# Patient Record
Sex: Female | Born: 1950 | Race: White | Hispanic: No | Marital: Married
Health system: Southern US, Community
[De-identification: ages and names within clinical notes are randomized; demographics above are authoritative.]

## PROBLEM LIST (undated history)

## (undated) DIAGNOSIS — K7460 Unspecified cirrhosis of liver: Secondary | ICD-10-CM

## (undated) DIAGNOSIS — M810 Age-related osteoporosis without current pathological fracture: Secondary | ICD-10-CM

## (undated) DIAGNOSIS — I2510 Atherosclerotic heart disease of native coronary artery without angina pectoris: Secondary | ICD-10-CM

## (undated) DIAGNOSIS — C801 Malignant (primary) neoplasm, unspecified: Secondary | ICD-10-CM

## (undated) DIAGNOSIS — E119 Type 2 diabetes mellitus without complications: Secondary | ICD-10-CM

---

## 1999-07-14 ENCOUNTER — Encounter: Admission: RE | Admit: 1999-07-14 | Discharge: 1999-07-14 | Payer: Self-pay | Admitting: Family Medicine

## 1999-07-22 ENCOUNTER — Encounter: Payer: Self-pay | Admitting: Family Medicine

## 1999-07-22 ENCOUNTER — Encounter: Admission: RE | Admit: 1999-07-22 | Discharge: 1999-07-22 | Payer: Self-pay | Admitting: Family Medicine

## 1999-07-29 ENCOUNTER — Encounter: Admission: RE | Admit: 1999-07-29 | Discharge: 1999-07-29 | Payer: Self-pay | Admitting: Family Medicine

## 1999-07-29 ENCOUNTER — Encounter: Payer: Self-pay | Admitting: Family Medicine

## 1999-08-01 ENCOUNTER — Encounter: Admission: RE | Admit: 1999-08-01 | Discharge: 1999-08-01 | Payer: Self-pay | Admitting: Family Medicine

## 1999-08-01 ENCOUNTER — Encounter: Payer: Self-pay | Admitting: Family Medicine

## 1999-08-30 ENCOUNTER — Encounter (INDEPENDENT_AMBULATORY_CARE_PROVIDER_SITE_OTHER): Payer: Self-pay | Admitting: *Deleted

## 1999-08-30 ENCOUNTER — Ambulatory Visit (HOSPITAL_COMMUNITY): Admission: RE | Admit: 1999-08-30 | Discharge: 1999-08-31 | Payer: Self-pay | Admitting: *Deleted

## 2000-05-09 ENCOUNTER — Encounter: Admission: RE | Admit: 2000-05-09 | Discharge: 2000-08-07 | Payer: Self-pay | Admitting: Family Medicine

## 2000-08-30 ENCOUNTER — Encounter: Admission: RE | Admit: 2000-08-30 | Discharge: 2000-11-28 | Payer: Self-pay | Admitting: Family Medicine

## 2000-12-14 ENCOUNTER — Encounter: Admission: RE | Admit: 2000-12-14 | Discharge: 2001-03-14 | Payer: Self-pay | Admitting: Family Medicine

## 2001-01-07 ENCOUNTER — Encounter: Admission: RE | Admit: 2001-01-07 | Discharge: 2001-01-07 | Payer: Self-pay | Admitting: Family Medicine

## 2001-01-07 ENCOUNTER — Encounter: Payer: Self-pay | Admitting: Family Medicine

## 2001-10-23 ENCOUNTER — Ambulatory Visit (HOSPITAL_COMMUNITY): Admission: RE | Admit: 2001-10-23 | Discharge: 2001-10-23 | Payer: Self-pay | Admitting: Family Medicine

## 2002-02-18 ENCOUNTER — Encounter: Admission: RE | Admit: 2002-02-18 | Discharge: 2002-02-18 | Payer: Self-pay | Admitting: Family Medicine

## 2002-02-18 ENCOUNTER — Encounter: Payer: Self-pay | Admitting: Family Medicine

## 2002-04-02 ENCOUNTER — Ambulatory Visit (HOSPITAL_BASED_OUTPATIENT_CLINIC_OR_DEPARTMENT_OTHER): Admission: RE | Admit: 2002-04-02 | Discharge: 2002-04-02 | Payer: Self-pay | Admitting: Orthopedic Surgery

## 2004-06-28 ENCOUNTER — Observation Stay (HOSPITAL_COMMUNITY): Admission: RE | Admit: 2004-06-28 | Discharge: 2004-06-29 | Payer: Self-pay | Admitting: Urology

## 2004-07-03 ENCOUNTER — Emergency Department (HOSPITAL_COMMUNITY): Admission: EM | Admit: 2004-07-03 | Discharge: 2004-07-03 | Payer: Self-pay | Admitting: Emergency Medicine

## 2007-08-10 ENCOUNTER — Encounter: Admission: RE | Admit: 2007-08-10 | Discharge: 2007-08-10 | Payer: Self-pay | Admitting: Family Medicine

## 2007-08-20 ENCOUNTER — Encounter: Admission: RE | Admit: 2007-08-20 | Discharge: 2007-08-20 | Payer: Self-pay | Admitting: Family Medicine

## 2007-09-20 ENCOUNTER — Ambulatory Visit: Payer: Self-pay | Admitting: Vascular Surgery

## 2007-09-20 ENCOUNTER — Encounter (INDEPENDENT_AMBULATORY_CARE_PROVIDER_SITE_OTHER): Payer: Self-pay | Admitting: Orthopedic Surgery

## 2007-09-20 ENCOUNTER — Ambulatory Visit (HOSPITAL_COMMUNITY): Admission: RE | Admit: 2007-09-20 | Discharge: 2007-09-20 | Payer: Self-pay | Admitting: Orthopedic Surgery

## 2008-01-28 ENCOUNTER — Encounter: Admission: RE | Admit: 2008-01-28 | Discharge: 2008-01-28 | Payer: Self-pay | Admitting: Family Medicine

## 2008-11-20 ENCOUNTER — Observation Stay (HOSPITAL_COMMUNITY): Admission: EM | Admit: 2008-11-20 | Discharge: 2008-11-21 | Payer: Self-pay | Admitting: Emergency Medicine

## 2009-08-24 ENCOUNTER — Ambulatory Visit: Payer: Self-pay | Admitting: Diagnostic Radiology

## 2009-08-24 ENCOUNTER — Encounter: Payer: Self-pay | Admitting: Emergency Medicine

## 2009-08-24 ENCOUNTER — Inpatient Hospital Stay (HOSPITAL_COMMUNITY): Admission: EM | Admit: 2009-08-24 | Discharge: 2009-08-26 | Payer: Self-pay | Admitting: Emergency Medicine

## 2009-08-25 ENCOUNTER — Encounter (INDEPENDENT_AMBULATORY_CARE_PROVIDER_SITE_OTHER): Payer: Self-pay | Admitting: General Surgery

## 2010-06-08 ENCOUNTER — Encounter: Admission: RE | Admit: 2010-06-08 | Discharge: 2010-06-08 | Payer: Self-pay | Admitting: Family Medicine

## 2011-01-04 LAB — DIFFERENTIAL
Basophils Absolute: 0.2 10*3/uL — ABNORMAL HIGH (ref 0.0–0.1)
Lymphs Abs: 0.6 10*3/uL — ABNORMAL LOW (ref 0.7–4.0)
Monocytes Absolute: 0.2 10*3/uL (ref 0.1–1.0)
Neutrophils Relative %: 94 % — ABNORMAL HIGH (ref 43–77)

## 2011-01-04 LAB — BASIC METABOLIC PANEL
CO2: 23 mEq/L (ref 19–32)
Calcium: 9.1 mg/dL (ref 8.4–10.5)
Chloride: 104 mEq/L (ref 96–112)
GFR calc Af Amer: >60 mL/min (ref >60)
GFR calc non Af Amer: >60 mL/min (ref >60)
Glucose, Bld: 111 mg/dL — ABNORMAL HIGH (ref 70–99)
Potassium: 3.5 mEq/L (ref 3.5–5.1)
Sodium: 140 mEq/L (ref 135–145)

## 2011-01-04 LAB — URINE MICROSCOPIC-ADD ON

## 2011-01-04 LAB — CBC
MCHC: 34.7 g/dL (ref 30.0–36.0)
RBC: 4.97 MIL/uL (ref 3.87–5.11)
WBC: 17.4 10*3/uL — ABNORMAL HIGH (ref 4.0–10.5)

## 2011-01-04 LAB — URINALYSIS, ROUTINE W REFLEX MICROSCOPIC
Glucose, UA: NEGATIVE mg/dL
Urobilinogen, UA: 1 mg/dL (ref 0.0–1.0)
pH: 6 (ref 5.0–8.0)

## 2011-01-17 LAB — COMPREHENSIVE METABOLIC PANEL
Albumin: 3.7 g/dL (ref 3.5–5.2)
Alkaline Phosphatase: 87 U/L (ref 39–117)
BUN: 13 mg/dL (ref 6–23)
BUN: 9 mg/dL (ref 6–23)
CO2: 24 mEq/L (ref 19–32)
CO2: 27 mEq/L (ref 19–32)
Calcium: 8.8 mg/dL (ref 8.4–10.5)
Calcium: 9.1 mg/dL (ref 8.4–10.5)
Chloride: 109 mEq/L (ref 96–112)
Creatinine, Ser: 0.59 mg/dL (ref 0.4–1.2)
Creatinine, Ser: 0.66 mg/dL (ref 0.4–1.2)
GFR calc Af Amer: >60 mL/min (ref >60)
GFR calc non Af Amer: >60 mL/min (ref >60)
Potassium: 3.9 mEq/L (ref 3.5–5.1)
Sodium: 141 mEq/L (ref 135–145)
Sodium: 141 mEq/L (ref 135–145)
Total Bilirubin: 0.5 mg/dL (ref 0.3–1.2)
Total Protein: 6.4 g/dL (ref 6.0–8.3)
Total Protein: 6.7 g/dL (ref 6.0–8.3)

## 2011-01-17 LAB — DIFFERENTIAL
Lymphs Abs: 3 10*3/uL (ref 0.7–4.0)
Monocytes Absolute: 1 10*3/uL (ref 0.1–1.0)
Monocytes Relative: 6 % (ref 3–12)
Neutrophils Relative %: 75 % (ref 43–77)

## 2011-01-17 LAB — POCT I-STAT, CHEM 8
Calcium, Ion: 1.11 mmol/L — ABNORMAL LOW (ref 1.12–1.32)
Chloride: 106 mEq/L (ref 96–112)
Creatinine, Ser: 0.6 mg/dL (ref 0.4–1.2)
Hemoglobin: 16.3 g/dL — ABNORMAL HIGH (ref 12.0–15.0)
TCO2: 24 mmol/L (ref 0–100)

## 2011-01-17 LAB — PROTIME-INR: Prothrombin Time: 12.5 seconds (ref 11.6–15.2)

## 2011-01-17 LAB — CBC
HCT: 45.1 % (ref 36.0–46.0)
MCHC: 35.6 g/dL (ref 30.0–36.0)
MCV: 93.8 fL (ref 78.0–100.0)
Platelets: 251 10*3/uL (ref 150–400)
Platelets: 304 10*3/uL (ref 150–400)
RBC: 4.86 MIL/uL (ref 3.87–5.11)
RDW: 12.8 % (ref 11.5–15.5)
WBC: 10.4 10*3/uL (ref 4.0–10.5)

## 2011-01-17 LAB — POCT CARDIAC MARKERS
CKMB, poc: 1.1 ng/mL (ref 1.0–8.0)
Myoglobin, poc: 40.5 ng/mL (ref 12–200)
Myoglobin, poc: 59.1 ng/mL (ref 12–200)
Troponin i, poc: <0.05 ng/mL (ref 0.00–0.09)

## 2011-01-17 LAB — APTT: aPTT: 138 seconds — ABNORMAL HIGH (ref 24–37)

## 2011-01-17 LAB — TSH: TSH: 0.568 u[IU]/mL (ref 0.350–4.500)

## 2011-02-14 NOTE — H&P (Signed)
Mallory Wright, SHAPPELL NO.:  [id number]   MEDICAL RECORD NO.:  [id number]          PATIENT TYPE:  INP   LOCATION:  3705                         FACILITY:  [facility]   PHYSICIAN:  Georga Hacking, M.D.DATE OF BIRTH:  1951/06/10   DATE OF ADMISSION:  11/20/2008  DATE OF DISCHARGE:                              HISTORY & PHYSICAL   REASON FOR ADMISSION:  Shoulder pain.   HISTORY:  A 60 year old female with a history of coronary artery disease  with a previous history of PTCA of the right coronary artery over 15  years ago.  She has continued to smoke since then and has been mildly  obese.  She was last seen a year ago and had a negative stress test at  that time.  She was in her usual state of health, but has been under  situational stress and awoke this morning with severe right shoulder  pain with some radiation up into her neck.  The pain then developed into  her right posterior back and then later developed some vague tightness  in her anterior chest area.  She took 2 old nitroglycerin, but the pain  was severe and she came to the emergency room.  By that time, she had  some heaviness in her chest and initial EKG showed some very minimal  lateral ST changes, but was not impressive and initial cardiac enzymes  were negative.  She is not currently having any pain.  She was  hypertensive on admission, but became somewhat hypotensive following  nitroglycerin.   Past history is remarkable for hypertension, hyperlipidemia, anxiety,  some depression in the past, and esophageal reflux.   PAST SURGERIES:  Bilateral carpal tunnel release, C-sections x2,  cholecystectomy.   ALLERGIES:  SULFA, PREDNISONE causes her to be hyper.   CURRENT MEDICATIONS:  Vytorin, diltiazem, Nexium, aspirin, and Paxil.   FAMILY HISTORY:  Please see the old records.   SOCIAL HISTORY:  She has been under significant situational stress at  work.  She says she smokes about 2 packs of cigarettes  per day.  No  significant alcohol use.  Lives with her husband.   REVIEW OF SYSTEMS:  Significant situational stress as noted above, some  weight gain.  No eye, ear, nose, or throat problems.  Some symptoms of  reflux, some arthritis previously of the knee with a previous knee  operation.  No stroke or TIA previously.  History of anxiety.  Other  than as noted above, the remainder of systems unremarkable.   PHYSICAL EXAMINATION:  GENERAL:  Anxious, mildly obese female in no  acute distress.  VITAL SIGNS:  Blood pressure is 110/80, pulse is currently 80 and  regular.  SKIN:  Warm and dry.  HEENT:  EOMI, PERRLA.  Pharynx negative.  CNS:  Clear.  NECK:  Supple.  No masses.  No JVD, thyromegaly, or bruits.  Pulses were  equal bilaterally.  LUNGS: Clear.  CARDIAC:  Normal S1 and S2.  No S3 or murmur.  ABDOMEN:  Soft, nontender.  EXTREMITIES:  Femoral distal pulse is 2+.   EKG  shows sinus rhythm, very minimal lateral changes, probably  nonsignificant.  Chest x-ray unremarkable.  White count 17,900.  Other  than that, chemistry panel and remainder of lab was negative.   IMPRESSION:  1. Acute right shoulder pain with radiation to the neck.  This is      different than her previous angina according to her, although she      does have some residual chest tightness.  This could be atypical      angina in a patient with known coronary artery disease.  Other      possibilities could be dissection, pulmonary embolus, although      doubt with negative D-dimer.  2. Coronary artery disease with previous percutaneous transluminal      coronary angioplasty of the right coronary artery.  3. Ongoing cigarette abuse.  4. Hypertension.  5. Obesity.  6. Anxiety and depression.  7. History of reflux.   RECOMMENDATIONS:  The patient will be started on heparin and if she has  recurrent pain, begin IV nitroglycerin, rule out MI with serial CPK and  EKG.  Consider catheterization to exclude  progression of coronary artery  disease.      Georga Hacking, M.D.  Electronically Signed     WST/MEDQ  D:  11/20/2008  T:  11/20/2008  Job:  [id number]   cc:   [facility]

## 2011-02-14 NOTE — Cardiovascular Report (Signed)
Mallory Wright, HAACK NO.:  [id number]   MEDICAL RECORD NO.:  [id number]          PATIENT TYPE:  INP   LOCATION:  3709                         FACILITY:  [facility]   PHYSICIAN:  Georga Hacking, M.D.DATE OF BIRTH:  09/20/1951   DATE OF PROCEDURE:  11/20/2008  DATE OF DISCHARGE:                            CARDIAC CATHETERIZATION   HISTORY:  This is a 60 year old female with a previous history of  coronary artery disease with PTCA of the right coronary artery.  She  presented with severe acute right shoulder pain radiating into her neck  which then became anterior in her chest.  Initial cardiac enzymes were  negative.  Pain was somewhat different from previous angina but was  still suggestive of ischemia.   PROCEDURE:  Left heart catheterization with coronary angiograms and left  ventriculogram.   COMMENTS ABOUT PROCEDURE:  The procedure was done in the Cardiac  Catheterization Laboratory.  She was prepped and draped in the usual  manner and given 2 mg of Versed intravenously for sedation.  The right  femoral artery was entered using a single anterior needle wall stick and  the procedure was done using 6-French catheters.  A 30 mL ventriculogram  was performed at the end of the procedure.  She tolerated the procedure  well.   HEMODYNAMIC DATA:  1. Aorta post-contrast 190/60.  2. LV post-contrast 190/3-13.   ANGIOGRAPHIC DATA:  Left ventriculogram:  Performed in the 30-degree RAO  projection.  The aortic valve is normal.  The mitral valve is normal.  The left ventricle appears normal in size.  The estimated ejection  fraction was 65-70%.  Coronary arteries arise and distribute normally.  There is very minimal coronary calcification noted.  The left main  coronary artery is normal.  The left anterior descending is calcified  proximally with an eccentric 40-50% proximal stenosis.  The diagonal  branch has 20% proximal stenosis.  Distal vessel contained scattered  irregularities.  Circumflex coronary artery contains 2 marginal arteries  and mild calcification.  No significant disease is noted, although there  are scattered luminal irregularities noted.  The right coronary artery  is a large dominant vessel.  The previous angioplasty site is widely  patent.  Mild calcification is noted in the mid vessel.  Posterior  descending and posterolateral branch arise and contain no significant  stenoses.   IMPRESSION:  1. Coronary artery disease predominately involving the proximal left      anterior descending of moderate severity, mild irregularities      involving the circumflex and right coronary artery.  2. Normal left ventricular function.   RECOMMENDATIONS:  Evaluation for other sources of shoulder pain.  Advised to stop smoking and weight reduction.      Georga Hacking, M.D.  Electronically Signed     WST/MEDQ  D:  11/20/2008  T:  11/21/2008  Job:  [id number]

## 2011-02-17 NOTE — Op Note (Signed)
[facility]  Patient:    Mallory Wright, Mallory Wright Visit Number: [id number] MRN: [id number]          Service Type: DSU Location: [facility] Attending Physician:  Marlowe Shores Dictated by:   Artist Pais Mina Marble, M.D. Proc. Date: 04/02/02 Admit Date:  04/02/2002                             Operative Report  PREOPERATIVE DIAGNOSIS:  Right carpal tunnel syndrome.  POSTOPERATIVE DIAGNOSIS:  Right carpal tunnel syndrome.  PROCEDURE:  Right carpal tunnel release.  SURGEON:  Artist Pais. Mina Marble, M.D.  ASSISTANT:  Junius Roads. Ireton, P.A.C.  ANESTHESIA:  General.  TOURNIQUET TIME:  12 minutes.  DRAINS:  None.  DESCRIPTION OF PROCEDURE: The patient was taken to the operating room, where after the induction of adequate general anesthesia the right upper extremity was prepped and draped in the usual sterile fashion.  An Esmarch was used to exsanguinate the limb.  A tourniquet was inflated to 250 mmHg.  At this point in time a 2 cm incision was made in the palmar aspect of the right hand in line with the long finger metacarpal starting at Kaplans cardinal line.  The incision was taken down through the skin and subcutaneous tissues until the palmar fascia was identified.  The palmar fascia was split longitudinally, thus exposing the superficial palmar arch and the distal edge of the transverse carpal ligament.  The superficial palmar arch was retracted distally and the transverse carpal ligament was divided with a 15 blade, exposing the underlying median nerve and contents of the carpal canal.  Once this was done, the [facility] elevator was passed dorsal and volar to the remaining aspect of the ligament, which was divided under direct vision using a blunt scissors.  After this was done, the canal was inspected, no interosseous or ganglion lesions present.  The wound was thoroughly irrigated and closed with a running 3-0 Prolene subcuticular stitch.  Steri-Strips, 4  x 4s, and fluffs and a compressive hand dressing were applied.  The patient tolerated the procedure well, went to the recovery room in stable fashion. Dictated by:   Artist Pais Mina Marble, M.D. Attending Physician:  Marlowe Shores DD:  04/02/02 TD:  04/04/02 Job: [id number]

## 2011-02-17 NOTE — Op Note (Signed)
Mallory Wright, Mallory Wright NO.:  [id number]   MEDICAL RECORD NO.:  [id number]          PATIENT TYPE:  OBV   LOCATION:  0347                         FACILITY:  [facility]   PHYSICIAN:  Gita Kudo, M.D. DATE OF BIRTH:  June 23, 1951   DATE OF PROCEDURE:  06/28/2004  DATE OF DISCHARGE:                                 OPERATIVE REPORT   OPERATIVE PROCEDURE:  Laparoscopic umbilical/incisional hernia repair.   SURGEON:  Gita Kudo, M.D.   ASSISTANT:  Velora Heckler, M.D.   ANESTHESIA:  General endotracheal.   PREOPERATIVE DIAGNOSIS:  Incisional hernia.   POSTOPERATIVE DIAGNOSIS:  Incisional hernia.   CLINICAL SUMMARY:  A 60 year old female with urologic problems that Dr.  Isabel Caprice is taking care of and also a hernia at her umbilicus.  She had a  laparoscopic cholecystectomy approximately two years ago, and this hernia  has developed following that.   OPERATIVE FINDINGS:  Dr. Isabel Caprice described his operation separately.  The  patient had a hernia of her abdominal wall with at least three defects,  including one large one.  A large piece (15 x 20 cm) of Proceed surgical  mesh was used with good 4 cm overlap of the entire weakened area.   PROCEDURE:  After Dr. Isabel Caprice finished his procedure, the patient was  positioned, prepped and draped in a standard fashion.  An Optiview port was  used in the left upper quadrant to provide access, and then the camera  placed, and two other #11 ports placed, left lower quadrant and right  lateral abdomen.  Then a spinal needle was used to delineate the defect and  marked.  Measuring out 4 cm from the defect, we then completed this as an  ellipse, and it measured approximately 13 x 19 cm.  We selected a 15 x 20 cm  piece of mesh, Proceed, to use.  A mesh was prepared by placing eight  symmetrical sutures around the periphery and marking it.  Then the mesh was  inserted into the abdomen, and we were certain that the Prolene side was  up  towards the abdominal wall.  Then at symmetrically placed skin incisions,  the sutures were brought and tied, and the mesh was well deployed and under  just a little tension, as wished.  Following this, two concentric rows of  staples were used to tap down the mesh, and it was well placed and in good  position without any sagging or open areas.  Following this, the ports and  CO2 were released.  The smaller incisions were approximated with Steri-Strips.  The three larger  were closed with 4-0 Vicryl subcu and Steri-Strips.  Sterile dressings and a  binder were then applied, and the patient went from the recovery room from  the operating room in good condition.      MRL/MEDQ  D:  06/28/2004  T:  06/28/2004  Job:  [id number]   cc:   Valetta Fuller, M.D.  [address]  [place]  [place] [address]  Fax: [phone]   W. Ashley Royalty.,  M.D.  [address]  [place]  [place] [address]  Fax: [phone]   Attn:  Elizabeth Palau, FNP [facility]

## 2011-02-17 NOTE — Discharge Summary (Signed)
Mallory Wright, Mallory Wright NO.:  [id number]   MEDICAL RECORD NO.:  [id number]          PATIENT TYPE:  OBV   LOCATION:  3709                         FACILITY:  [facility]   PHYSICIAN:  Georga Hacking, M.D.DATE OF BIRTH:  18-Mar-1951   DATE OF ADMISSION:  11/20/2008  DATE OF DISCHARGE:  11/21/2008                               DISCHARGE SUMMARY   FINAL DIAGNOSES:  1. Acute right shoulder pain with neck radiation, suggestive cervical      disk disease, myocardial infarction ruled out.  2. Chest pain, myocardial function ruled out.  3. Coronary artery disease with previous percutaneous transluminal      coronary angioplasty of the right coronary artery with widely      patent percutaneous transluminal coronary angioplasty site for many      years ago, minimal circumflex disease, and moderate 50% left      anterior descending stenosis, normal left ventricular function.  4. Ongoing cigarette abuse.  5. Hypertension.  6. Hyperlipidemia.  7. Anxiety and depression.  8. History of reflux.   PROCEDURES:  Cardiac catheterization and MRI of the cervical spine.   HISTORY OF PRESENT ILLNESS:  A 60 year old female with a history of  previous PTCA of the right coronary artery over 50 years ago.  She has  continued to smoke since then and has been mildly obese.  She was in her  usual state of health, but under situational stress and woke the morning  of admission with severe right shoulder pain with radiation up into her  neck.  The pain then developed into her right posterior back and later  vague tightness in her anterior chest area.  She took 2 old  nitroglycerin, but the pain was severe.  She came to the emergency room.  By that time, she had heaviness in her chest and initial EKG showed  minimal lateral ST changes, but not impressive and initial enzymes were  negative.  She was admitted for further evaluation.  Please see  previously dictated history and physical for remainder  of the details.   HOSPITAL COURSE:  Initial white count was 17,900, but was 10,400 the  next day.  Hemoglobin is 13.6, hematocrit of 39.1.  PT and PTT were  normal.  Chemistry panel showed glucose of 130, creatinine of 0.66, BUN  13, sodium 141, potassium 3.9, and chloride 107.  Serial CPK and MBs  were negative.  TSH is 0.568.  MRA of the neck showed some cervical disk  disease.  The final report is not back in the chart at time of  dictation.  She was treated overnight with some heparin and the next day  was taken to the Cath Lab.  Left main coronary artery was normal.  Left  anterior descending has a calcific 40-50% proximal stenosis.  Circumflex  had irregularities.  Right coronary angioplasty she had 16 years ago was  widely patent.  The MRA showed mild spinal stenosis with disk bulge at  C5 and C6 with degeneration.  She went home the next day and her cath  site was checked and  was doing well.   DISCHARGE MEDICATIONS:  1. Nexium 40 mg daily.  2. Vytorin daily.  3. Diltiazem 120 mg daily.  4. Paxil 20 mg daily.  5. Zyrtec 10 mg daily.  6. Garlic daily.  7. Fish oil daily.  8. Glucosamine.  9. Aspirin.  10.She is to use ibuprofen as needed for pain.   She is to follow up in the office in 1 week.      Georga Hacking, M.D.  Electronically Signed     WST/MEDQ  D:  12/21/2008  T:  12/22/2008  Job:  [id number]   cc:   [facility]

## 2011-02-17 NOTE — Op Note (Signed)
NAMECAPRICIA, Mallory Wright NO.:  [id number]   MEDICAL RECORD NO.:  [id number]          PATIENT TYPE:  OBV   LOCATION:  0347                         FACILITY:  [facility]   PHYSICIAN:  Gita Kudo, M.D. DATE OF BIRTH:  August 21, 1951   DATE OF PROCEDURE:  06/29/2004  DATE OF DISCHARGE:                                 OPERATIVE REPORT   ADDENDUM:  I did not mention in the original operative note that after the  ports were placed and the camera inserted, I operated through the lower port  to reduce the incarcerated omentum that was in the hernia defect.  The  omentum came out with gentle traction, and cautery was used to control any  bleed.  There was a fair amount of omentum, and it was taken out  successfully and without any complication.  Following that, the defects were  noted as above and repaired as described.      MRL/MEDQ  D:  06/29/2004  T:  06/29/2004  Job:  [id number]

## 2011-02-17 NOTE — Op Note (Signed)
Mallory Wright, PUCCINELLI                 ACCOUNT NO.:  [id number]   MEDICAL RECORD NO.:  [id number]          PATIENT TYPE:  AMB   LOCATION:  DAY                          [facility]ACILITY[facility]   PHYSICIAN:  Valetta Fuller, M.D.  DATE OF BIRTH:  09-07-1951   DATE OF PROCEDURE:  06/28/2004  DATE OF DISCHARGE:                                 OPERATIVE REPORT   PREOPERATIVE DIAGNOSIS:  Stress urinary incontinence.   POSTOPERATIVE DIAGNOSIS:  Stress urinary incontinence.   PROCEDURE PERFORMED:  Transobturator suburethral sling.   SURGEON:  Valetta Fuller, M.D.   ANESTHESIA:  General.   INDICATIONS:  Ms. Cosman came to see me recently with progressive stress  incontinence.  She had moderate to severe urinary incontinence with  coughing, sneezing, and laughing.  She was wearing pads on a regular basis.  The patient has also had some incisional hernias which appear to require  surgical intervention, and she has been evaluated by Dr. Maryagnes Amos.  When I saw  her, she did have a mild cystocele with urethral hypermobility.  She did  have significant objective stress urinary incontinence.  The patient  underwent extensive counseling with regard to treatment options.  She was  felt to be a reasonably good candidate for suburethral sling.  She appeared  to understand the advantages and disadvantages of surgery as well as the  success rates, potential complications, etc.  We are planning a combined  procedure with Dr. Maryagnes Amos to address the incisional hernias.   TECHNIQUE AND FINDINGS:  The patient was brought to the operating room where  she had successful induction of general anesthesia.  She was placed in  lithotomy position and prepped and draped in the usual manner.  A Foley  catheter was inserted, and the bladder was completely drained.  With a  weighted vaginal speculum, we noticed the patient did have a mild cystocele,  but we really did not feel that formal anterior repair was really required.  For  that reason, we made a small 2 cm incision over the mid urethra.  Vaginal planes were dissected until we were able to feel along the pelvic  sidewall.  We decided to do a transobturator approach and make two markings  about 4.5 cm lateral at the level of the clitoris on both sides.  Needles  were passed in the standard manner with direct digital finger control.  The  sling material was then passed, and a right-angle clamp was utilized  underneath the sling at the level of the mid urethra.  Once we felt tension  was appropriate, the redundant sling was cut.  Those small incisions were  closed with Dermabond.  The vaginal incision was closed with a running 2-0  Vicryl suture, and some vaginal packing was applied.  The patient appeared  to tolerate the procedure well, and there were no obvious complications.      DSG/MEDQ  D:  06/28/2004  T:  06/28/2004  Job:  [id number]

## 2011-10-01 ENCOUNTER — Other Ambulatory Visit: Payer: Self-pay | Admitting: Cardiology

## 2011-10-23 ENCOUNTER — Other Ambulatory Visit: Payer: Self-pay | Admitting: Cardiology

## 2012-03-21 ENCOUNTER — Other Ambulatory Visit: Payer: Self-pay | Admitting: Family Medicine

## 2012-03-21 DIAGNOSIS — Z1231 Encounter for screening mammogram for malignant neoplasm of breast: Secondary | ICD-10-CM

## 2012-04-17 ENCOUNTER — Ambulatory Visit
Admission: RE | Admit: 2012-04-17 | Discharge: 2012-04-17 | Disposition: A | Discharge status: Home / Self Care | Payer: 59 | Source: Ambulatory Visit | Attending: Family Medicine | Admitting: Family Medicine

## 2012-04-17 DIAGNOSIS — Z1231 Encounter for screening mammogram for malignant neoplasm of breast: Secondary | ICD-10-CM

## 2012-10-10 ENCOUNTER — Ambulatory Visit
Admission: RE | Admit: 2012-10-10 | Discharge: 2012-10-10 | Disposition: A | Discharge status: Home / Self Care | Payer: 59 | Source: Ambulatory Visit | Attending: Cardiology | Admitting: Cardiology

## 2012-10-10 ENCOUNTER — Other Ambulatory Visit: Payer: Self-pay | Admitting: Cardiology

## 2012-10-10 DIAGNOSIS — R0989 Other specified symptoms and signs involving the circulatory and respiratory systems: Secondary | ICD-10-CM

## 2012-10-10 DIAGNOSIS — R0609 Other forms of dyspnea: Secondary | ICD-10-CM

## 2012-10-16 ENCOUNTER — Encounter (INDEPENDENT_AMBULATORY_CARE_PROVIDER_SITE_OTHER): Payer: 59 | Admitting: *Deleted

## 2012-10-16 DIAGNOSIS — R609 Edema, unspecified: Secondary | ICD-10-CM

## 2013-08-06 ENCOUNTER — Ambulatory Visit: Payer: 59

## 2013-08-13 ENCOUNTER — Ambulatory Visit: Payer: 59

## 2013-08-19 ENCOUNTER — Ambulatory Visit: Payer: 59

## 2013-08-20 ENCOUNTER — Ambulatory Visit: Payer: 59

## 2013-08-26 ENCOUNTER — Ambulatory Visit: Payer: 59

## 2013-09-02 ENCOUNTER — Ambulatory Visit: Payer: 59

## 2015-11-04 ENCOUNTER — Other Ambulatory Visit: Payer: Self-pay

## 2015-11-04 DIAGNOSIS — Z1231 Encounter for screening mammogram for malignant neoplasm of breast: Secondary | ICD-10-CM

## 2015-11-17 ENCOUNTER — Ambulatory Visit: Payer: Self-pay

## 2015-11-24 ENCOUNTER — Ambulatory Visit
Admission: RE | Admit: 2015-11-24 | Discharge: 2015-11-24 | Disposition: A | Discharge status: Home / Self Care | Payer: Commercial Managed Care - HMO | Source: Ambulatory Visit

## 2015-11-24 DIAGNOSIS — Z1231 Encounter for screening mammogram for malignant neoplasm of breast: Secondary | ICD-10-CM

## 2016-12-11 ENCOUNTER — Other Ambulatory Visit: Payer: Self-pay | Admitting: Physician Assistant

## 2016-12-11 DIAGNOSIS — Z1231 Encounter for screening mammogram for malignant neoplasm of breast: Secondary | ICD-10-CM

## 2016-12-29 ENCOUNTER — Ambulatory Visit
Admission: RE | Admit: 2016-12-29 | Discharge: 2016-12-29 | Disposition: A | Discharge status: Home / Self Care | Payer: Commercial Managed Care - HMO | Source: Ambulatory Visit | Attending: Physician Assistant | Admitting: Physician Assistant

## 2016-12-29 DIAGNOSIS — Z1231 Encounter for screening mammogram for malignant neoplasm of breast: Secondary | ICD-10-CM

## 2017-05-21 ENCOUNTER — Other Ambulatory Visit (HOSPITAL_COMMUNITY): Payer: Self-pay | Admitting: Internal Medicine

## 2017-05-21 DIAGNOSIS — K7469 Other cirrhosis of liver: Secondary | ICD-10-CM

## 2017-05-28 ENCOUNTER — Ambulatory Visit (HOSPITAL_COMMUNITY): Payer: Commercial Managed Care - HMO

## 2017-05-28 ENCOUNTER — Ambulatory Visit (HOSPITAL_COMMUNITY)
Admission: RE | Admit: 2017-05-28 | Discharge: 2017-05-28 | Disposition: A | Discharge status: Home / Self Care | Payer: 59 | Source: Ambulatory Visit | Attending: Internal Medicine | Admitting: Internal Medicine

## 2017-05-28 DIAGNOSIS — K7469 Other cirrhosis of liver: Secondary | ICD-10-CM | POA: Diagnosis not present

## 2018-01-10 ENCOUNTER — Other Ambulatory Visit: Payer: Self-pay | Admitting: Physician Assistant

## 2018-01-10 DIAGNOSIS — Z1231 Encounter for screening mammogram for malignant neoplasm of breast: Secondary | ICD-10-CM

## 2018-01-14 ENCOUNTER — Ambulatory Visit
Admission: RE | Admit: 2018-01-14 | Discharge: 2018-01-14 | Disposition: A | Discharge status: Home / Self Care | Payer: Medicare Other | Source: Ambulatory Visit | Attending: Physician Assistant | Admitting: Physician Assistant

## 2018-01-14 DIAGNOSIS — Z1231 Encounter for screening mammogram for malignant neoplasm of breast: Secondary | ICD-10-CM

## 2018-01-15 ENCOUNTER — Other Ambulatory Visit: Payer: Self-pay | Admitting: Physician Assistant

## 2018-01-15 DIAGNOSIS — E2839 Other primary ovarian failure: Secondary | ICD-10-CM

## 2018-02-07 ENCOUNTER — Other Ambulatory Visit: Payer: Medicare Other

## 2018-02-11 ENCOUNTER — Ambulatory Visit
Admission: RE | Admit: 2018-02-11 | Discharge: 2018-02-11 | Disposition: A | Discharge status: Home / Self Care | Payer: Medicare Other | Source: Ambulatory Visit | Attending: Physician Assistant | Admitting: Physician Assistant

## 2018-02-11 DIAGNOSIS — E2839 Other primary ovarian failure: Secondary | ICD-10-CM

## 2018-02-25 ENCOUNTER — Emergency Department (HOSPITAL_COMMUNITY): Payer: Medicare Other

## 2018-02-25 ENCOUNTER — Encounter (HOSPITAL_COMMUNITY): Payer: Self-pay

## 2018-02-25 ENCOUNTER — Emergency Department (HOSPITAL_COMMUNITY)
Admission: EM | Admit: 2018-02-25 | Discharge: 2018-02-25 | Disposition: A | Discharge status: Home / Self Care | Payer: Medicare Other | Attending: Emergency Medicine | Admitting: Emergency Medicine

## 2018-02-25 DIAGNOSIS — N950 Postmenopausal bleeding: Secondary | ICD-10-CM | POA: Diagnosis not present

## 2018-02-25 DIAGNOSIS — M545 Low back pain: Secondary | ICD-10-CM | POA: Diagnosis present

## 2018-02-25 DIAGNOSIS — Z79899 Other long term (current) drug therapy: Secondary | ICD-10-CM | POA: Insufficient documentation

## 2018-02-25 DIAGNOSIS — Z85118 Personal history of other malignant neoplasm of bronchus and lung: Secondary | ICD-10-CM | POA: Insufficient documentation

## 2018-02-25 DIAGNOSIS — I2510 Atherosclerotic heart disease of native coronary artery without angina pectoris: Secondary | ICD-10-CM | POA: Insufficient documentation

## 2018-02-25 DIAGNOSIS — Z7982 Long term (current) use of aspirin: Secondary | ICD-10-CM | POA: Insufficient documentation

## 2018-02-25 DIAGNOSIS — E119 Type 2 diabetes mellitus without complications: Secondary | ICD-10-CM | POA: Insufficient documentation

## 2018-02-25 HISTORY — DX: Malignant (primary) neoplasm, unspecified: C80.1

## 2018-02-25 HISTORY — DX: Age-related osteoporosis without current pathological fracture: M81.0

## 2018-02-25 HISTORY — DX: Unspecified cirrhosis of liver: K74.60

## 2018-02-25 HISTORY — DX: Atherosclerotic heart disease of native coronary artery without angina pectoris: I25.10

## 2018-02-25 HISTORY — DX: Type 2 diabetes mellitus without complications: E11.9

## 2018-02-25 LAB — CBC WITH DIFFERENTIAL/PLATELET
Abs Immature Granulocytes: 0 10*3/uL (ref 0.0–0.1)
Basophils Absolute: 0.1 10*3/uL (ref 0.0–0.1)
Basophils Relative: 1 %
EOS PCT: 3 %
Eosinophils Absolute: 0.3 10*3/uL (ref 0.0–0.7)
HEMATOCRIT: 44.6 % (ref 36.0–46.0)
HEMOGLOBIN: 14.5 g/dL (ref 12.0–15.0)
Immature Granulocytes: 0 %
LYMPHS ABS: 2.3 10*3/uL (ref 0.7–4.0)
LYMPHS PCT: 20 %
MCH: 30.2 pg (ref 26.0–34.0)
MCHC: 32.5 g/dL (ref 30.0–36.0)
MCV: 92.9 fL (ref 78.0–100.0)
MONOS PCT: 9 %
Monocytes Absolute: 1 10*3/uL (ref 0.1–1.0)
Neutro Abs: 7.6 10*3/uL (ref 1.7–7.7)
Neutrophils Relative %: 67 %
Platelets: 266 10*3/uL (ref 150–400)
RBC: 4.8 MIL/uL (ref 3.87–5.11)
RDW: 12.5 % (ref 11.5–15.5)
WBC: 11.2 10*3/uL — AB (ref 4.0–10.5)

## 2018-02-25 LAB — BASIC METABOLIC PANEL
Anion gap: 9 (ref 5–15)
BUN: 11 mg/dL (ref 6–20)
CHLORIDE: 105 mmol/L (ref 101–111)
CO2: 26 mmol/L (ref 22–32)
Calcium: 9.7 mg/dL (ref 8.9–10.3)
Creatinine, Ser: 0.74 mg/dL (ref 0.44–1.00)
GFR calc Af Amer: >60 mL/min (ref >60)
GFR calc non Af Amer: >60 mL/min (ref >60)
GLUCOSE: 118 mg/dL — AB (ref 65–99)
POTASSIUM: 4.5 mmol/L (ref 3.5–5.1)
Sodium: 140 mmol/L (ref 135–145)

## 2018-02-25 LAB — URINALYSIS, ROUTINE W REFLEX MICROSCOPIC
Bacteria, UA: NONE SEEN
Bilirubin Urine: NEGATIVE
Glucose, UA: NEGATIVE mg/dL
Ketones, ur: NEGATIVE mg/dL
Leukocytes, UA: NEGATIVE
NITRITE: NEGATIVE
Protein, ur: NEGATIVE mg/dL
SPECIFIC GRAVITY, URINE: 1.015 (ref 1.005–1.030)
pH: 5 (ref 5.0–8.0)

## 2018-02-25 NOTE — ED Provider Notes (Signed)
Patient placed in Quick Look pathway, seen and evaluated   Chief Complaint: Lower back pain, hematuria  HPI:   She presents for evaluation of 1-1/2-week history of lower back pain.  She has noticed gross hematuria for the past 3 to 4 days.  She also reports blood when she wipes after urination.  Some diarrhea, nausea.  Denies vomiting.  Evaluated at urgent care and was found to have hematuria on urine dipstick and was sent here for further work-up for possible kidney stone.  Denies any injuries that incited the back pain.  Denies any numbness in legs, dysuria, loss of bowel or bladder function, prior back surgeries or fever.   ROS: Low back pain  Physical Exam:   Gen: No distress  Neuro: Awake and Alert  Skin: Warm    Focused Exam: No midline or paraspinal musculature tenderness to palpation of the lumbar spine.  Negative CVA tenderness bilaterally.   Initiation of care has begun. The patient has been counseled on the process, plan, and necessity for staying for the completion/evaluation, and the remainder of the medical screening examination    Delia Heady, PA-C 02/25/18 Schuyler, [place], DO 02/25/18 1508

## 2018-02-25 NOTE — ED Provider Notes (Signed)
[facility] Provider Note   CSN: [id number] Arrival date & time: 02/25/18  1354     History   Chief Complaint Chief Complaint  Patient presents with  . Hematuria    HPI Mallory Wright is a 67 y.o. female.  Patient is a 67 year old female with a history of coronary artery disease, diabetes, cirrhosis, lung cancer and osteoporosis who presents today with bleeding which she is not sure is coming from her vaginal area or urine, lower back pain that comes in waves as cramps and some mild nausea and intermittent bouts of loose stool.  This is been going on for the last week.  She denies any frequency, urgency or dysuria.  She has had no fevers.  Nothing seems to make the symptoms better or worse.  The bleeding has become a little bit heavier over the last 24 hours and she is wearing a pad in her underwear.  She denies any feelings of lightheadedness, chest pain or shortness of breath.  She takes no anticoagulation.  She sees her PCP Dr. Frederico Hamman regularly and last had her normal routine exam in February.  Patient is postmenopausal and cannot quite remember when her last period was but it was in her 64s.  She has never had bleeding since then until now.  The history is provided by the patient.    Past Medical History:  Diagnosis Date  . Cancer ([place])    lung  . Cirrhosis ([place])   . Coronary artery disease   . Diabetes mellitus without complication ([place])   . Osteoporosis     There are no active problems to display for this patient.   History reviewed. No pertinent surgical history.   OB History   None      Home Medications    Prior to Admission medications   Medication Sig Start Date End Date Taking? Authorizing Provider  aspirin EC 81 MG tablet Take 81 mg by mouth daily.   Yes [provider]  cetirizine (ZYRTEC) 10 MG tablet Take 10 mg by mouth daily.   Yes [provider]  Cinnamon 500 MG capsule Take 500 mg by mouth  daily.   Yes [provider]  diltiazem (CARDIZEM) 120 MG tablet Take 120 mg by mouth daily. 06/09/15  Yes [provider]  esomeprazole (NEXIUM) 40 MG capsule Take 40 mg by mouth daily at 12 noon.   Yes [provider]  ezetimibe-simvastatin (VYTORIN) 10-40 MG tablet Take 1 tablet by mouth daily. 06/03/15  Yes [provider]  Garlic Oil 2 MG CAPS Take 1,000 mg by mouth daily.   Yes [provider]  glucosamine-chondroitin 500-400 MG tablet Take 1 tablet by mouth 2 (two) times daily.   Yes [provider]  levothyroxine (SYNTHROID, LEVOTHROID) 25 MCG tablet Take 25 mcg by mouth daily. 01/13/18  Yes [provider]  lisinopril (PRINIVIL,ZESTRIL) 10 MG tablet Take 10 mg by mouth daily. 02/28/17  Yes [provider]  metFORMIN (GLUCOPHAGE-XR) 500 MG 24 hr tablet Take 1,000 mg by mouth 2 (two) times daily. 02/18/18  Yes [provider]  metoCLOPramide (REGLAN) 10 MG tablet Take 10 mg by mouth 3 (three) times daily. For colonoscopy prep. April 29, 2018 02/15/18  Yes [provider]  Multiple Vitamin (THERA) TABS Take 1 tablet by mouth daily.   Yes [provider]  nitroGLYCERIN (NITROSTAT) 0.4 MG SL tablet Place 0.4 mg under the tongue daily as needed for chest pain. 02/17/16  Yes [provider]  Omega-3 Fatty Acids (FISH OIL) 1000 MG CAPS Take 1 capsule by mouth daily.   Yes [provider]  omeprazole (PRILOSEC) 40 MG capsule Take 40 mg by mouth daily. 01/21/18  Yes [provider]  PARoxetine (PAXIL) 40 MG tablet Take 20 mg by mouth 2 (two) times daily.  02/08/18  Yes [provider]  TRULICITY 8.18 EX/9.3ZJ SOPN Inject 0.5 mLs into the skin once a week. 11/29/17  Yes [provider]  vitamin B-12 (CYANOCOBALAMIN) 1000 MCG tablet Take 1,000 mcg by mouth daily.   Yes [provider]    Family History Family History  Problem Relation Age of Onset  . Breast  cancer Maternal Aunt     Social History Social History   Tobacco Use  . Smoking status: Not on file  Substance Use Topics  . Alcohol use: Not on file  . Drug use: Not on file     Allergies   Prednisone; Banana; Claritin [loratadine]; Eggs or egg-derived products; and Sulfa antibiotics   Review of Systems Review of Systems  All other systems reviewed and are negative.    Physical Exam Updated Vital Signs BP (!) 112/57 (BP Location: Right Arm)   Pulse 74   Temp 98.4 F (36.9 C) (Oral)   Resp 15   SpO2 98%   Physical Exam  Constitutional: She is oriented to person, place, and time. She appears well-developed and well-nourished. No distress.  HENT:  Head: Normocephalic and atraumatic.  Eyes: Pupils are equal, round, and reactive to light. EOM are normal.  Cardiovascular: Normal rate, regular rhythm, normal heart sounds and intact distal pulses. Exam reveals no friction rub.  No murmur heard. Pulmonary/Chest: Effort normal and breath sounds normal. She has no wheezes. She has no rales.  Abdominal: Soft. Bowel sounds are normal. She exhibits no distension. There is tenderness. There is no rebound and no guarding.  Minimal suprapubic tenderness.  No CVA tenderness.  Genitourinary: Cervix exhibits no motion tenderness. Right adnexum displays no mass. Left adnexum displays no mass. There is bleeding in the vagina.  Musculoskeletal: Normal range of motion. She exhibits no tenderness.  No edema  Neurological: She is alert and oriented to person, place, and time. No cranial nerve deficit.  Skin: Skin is warm and dry. No rash noted.  Psychiatric: She has a normal mood and affect. Her behavior is normal.  Nursing note and vitals reviewed.    ED Treatments / Results  Labs (all labs ordered are listed, but only abnormal results are displayed) Labs Reviewed  URINALYSIS, ROUTINE W REFLEX MICROSCOPIC - Abnormal; Notable for the following components:      Result Value   Hgb  urine dipstick SMALL (*)    All other components within normal limits  BASIC METABOLIC PANEL - Abnormal; Notable for the following components:   Glucose, Bld 118 (*)    All other components within normal limits  CBC WITH DIFFERENTIAL/PLATELET - Abnormal; Notable for the following components:   WBC 11.2 (*)    All other components within normal limits    EKG None  Radiology Ct Renal Stone Study  Result Date: 02/25/2018 CLINICAL DATA:  Hematuria. EXAM: CT ABDOMEN AND PELVIS WITHOUT CONTRAST TECHNIQUE: Multidetector CT imaging of the abdomen and pelvis was performed following the standard protocol without IV contrast. COMPARISON:  CT abdomen 08/24/2009 FINDINGS: Lower chest: No acute abnormality. Hepatobiliary: No focal liver abnormality is seen. Mild nodular contour of the liver as can be seen  with cirrhosis. Prior cholecystectomy. No intrahepatic or extrahepatic biliary ductal dilatation. Pancreas: Unremarkable. No pancreatic ductal dilatation or surrounding inflammatory changes. Spleen: Normal in size without focal abnormality. Adrenals/Urinary Tract: Adrenal glands are unremarkable. Kidneys are normal, without renal calculi, focal lesion, or hydronephrosis. Bladder is unremarkable. Stomach/Bowel: Stomach is within normal limits. Appendix appears normal. No evidence of bowel wall thickening, distention, or inflammatory changes. Vascular/Lymphatic: Abdominal aortic atherosclerosis. Normal caliber abdominal aorta. No lymphadenopathy. Reproductive: Uterus and bilateral adnexa are unremarkable. Other: Prior ventral abdominal hernia repair. No abdominal or pelvic ascites. Musculoskeletal: No acute osseous abnormality. No aggressive osseous lesion. Mild osteoarthritis of bilateral sacroiliac joints. IMPRESSION: 1. No acute abdominal or pelvic pathology. 2.  Aortic Atherosclerosis (ICD10-I70.0). 3. Cirrhosis. Electronically Signed   By: Kathreen Devoid   On: 02/25/2018 16:27    Procedures Procedures  (including critical care time)  Medications Ordered in ED Medications - No data to display   Initial Impression / Assessment and Plan / ED Course  I have reviewed the triage vital signs and the nursing notes.  Pertinent labs & imaging results that were available during my care of the patient were reviewed by me and considered in my medical decision making (see chart for details).     Patient presenting today with symptoms concerning for postmenopausal bleeding.  Patient has had some crampy abdominal and back pain as well as some intermittent nausea and a few loose stools.  She denies any infectious symptoms.  She has no urinary symptoms.  Patient's urine does show some blood however feel is most likely contaminant but no evidence of infection.  Patient had a CT done while she was waiting from quick look that showed no acute pathology.  Low suspicion that this is a renal stone.  Low suspicion for UTI.  On exam patient does have vaginal bleeding.  Feel this is most likely the cause of her symptoms.  Recommended follow-up with OB/GYN for further evaluation and treatment.  Patient and her husband are comfortable with this plan.  Final Clinical Impressions(s) / ED Diagnoses   Final diagnoses:  Post-menopausal bleeding    ED Discharge Orders    None       Blanchie Dessert, MD 02/25/18 1900

## 2018-02-25 NOTE — ED Triage Notes (Signed)
Pt presents for evaluation of hematuria x 3-4 days with lower back pain x 1 week. Pt went to [facility] today and was sent here for further evaluation.

## 2018-06-06 ENCOUNTER — Telehealth: Payer: Self-pay

## 2018-06-06 NOTE — Telephone Encounter (Signed)
Per Joylene John NP- contact [facility] referral coordinator and give appt of 06-11-2018 with Dr Fermin Schwab at 12:15 pm.  No answer, left VM for [facility] Los referral coordinator to call our office for appt.  A few hours later, re-attempted her number, received VM again.  Called alternative number for office and office personnel "Beverlee Nims" said Verdis Frederickson was in meeting and she will take message regarding appt and they will contact the patient.  Appt info given to her with instructions to arrive 20 min early, directions given, and our contact information if pt or referral person has any additional questions.

## 2018-06-07 ENCOUNTER — Telehealth: Payer: Self-pay

## 2018-06-07 NOTE — Telephone Encounter (Signed)
After speaking with [facility] office and leaving appt info, reached out to patient today to see if upcoming appt on 06-11-2018 at 12:15 pm with Dr Fermin Schwab is ok or did she need a different time.  Pt verbalized that she has decided to go another route- she did not provide very much information and I asked if she had spoke with [facility] doctors about her decision and she indicated she had spoke to a physician.  I reminded her she has our number and can contact us in the future if she would like to speak to a specialist here.  Pt voiced understanding.    I called [facility] referral coordinator back "Verdis Frederickson" and she did not know anything about pt's decision not to be seen here.  She said she will touch base with pt's nurse there and call me back.  Notified Joylene John NP.

## 2018-06-07 NOTE — Telephone Encounter (Signed)
Verdis Frederickson the referral coordinator called to cancel appt for patient.  Per her, patient has appt with oncologist Dr Polly Cobia and has surgery scheduled.  Will notify Joylene John NP.

## 2019-03-04 IMAGING — CT CT RENAL STONE PROTOCOL
2 of 3 series · 17 of 46 positions shown, 19 images · non-contrast
Comparison: CT abdomen 08/24/2009

CLINICAL DATA: Hematuria.

EXAM:
CT ABDOMEN AND PELVIS WITHOUT CONTRAST
TECHNIQUE: Multidetector CT imaging of the abdomen and pelvis was performed
following the standard protocol without IV contrast.

[Series 3: renal stone 5.0 · axial · 0.98mm/px · z∈[+753,+1133]mm · 14 of 88 slices shown, 16 images]
[im 6/88  soft-tissue]
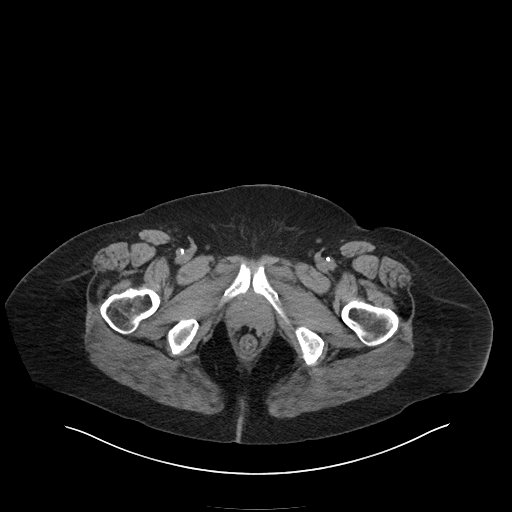
[im 6/88  bone]
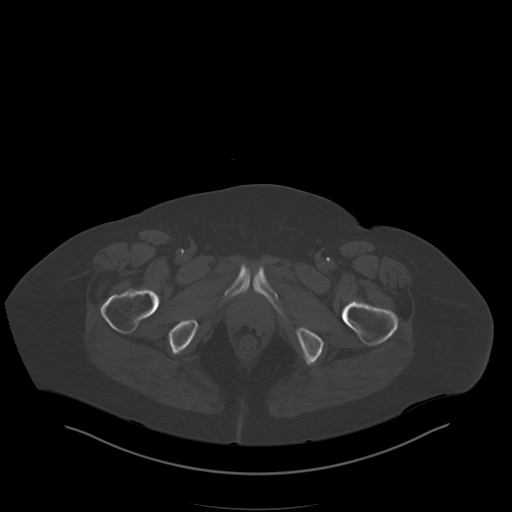
[im 12/88  soft-tissue]
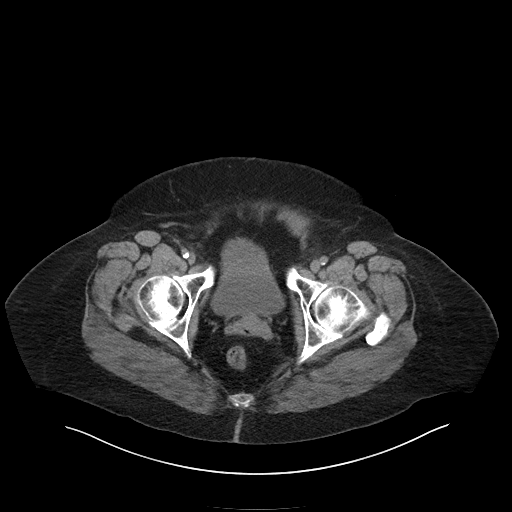
[im 17/88  soft-tissue]
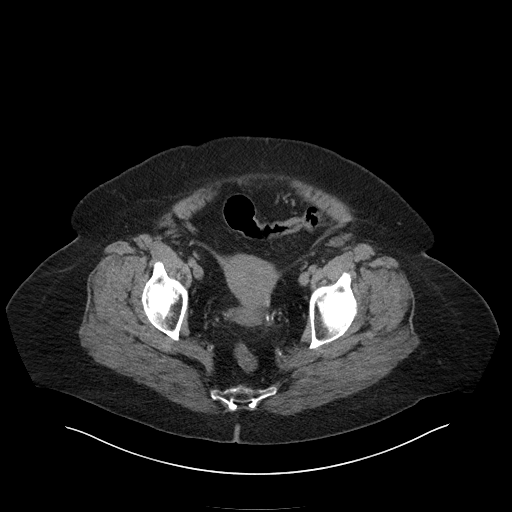
[im 23/88  soft-tissue]
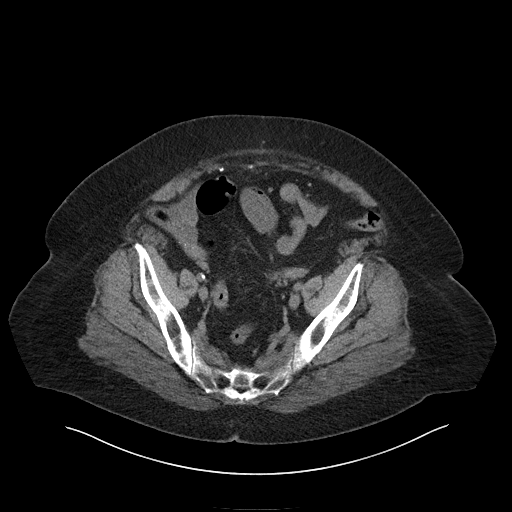
[im 29/88  soft-tissue]
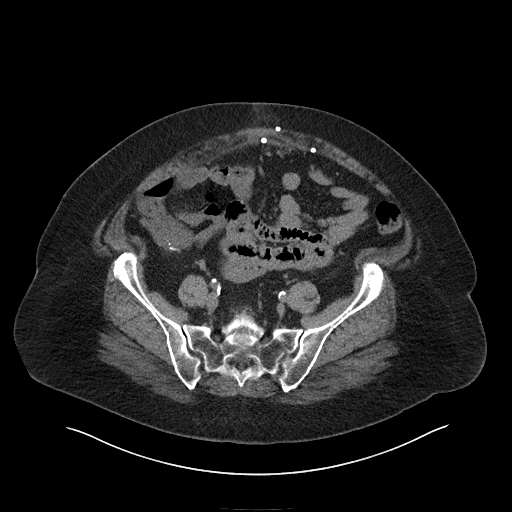
[im 34/88  soft-tissue]
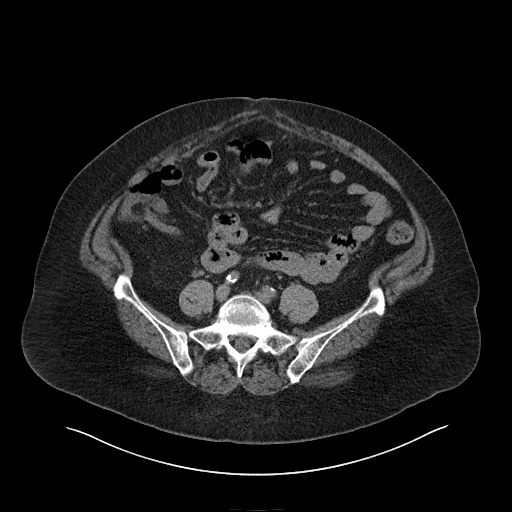
[im 40/88  soft-tissue]
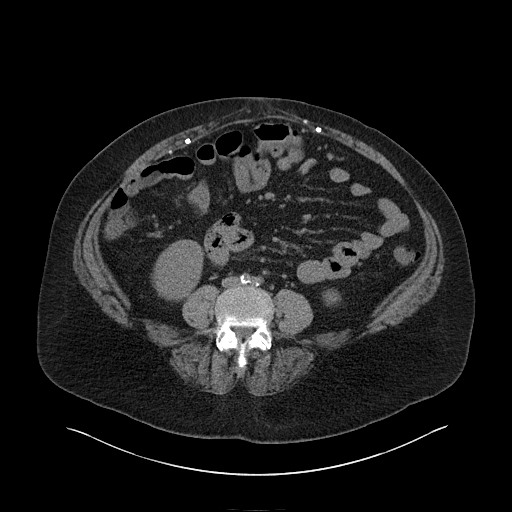
[im 48/88  soft-tissue]
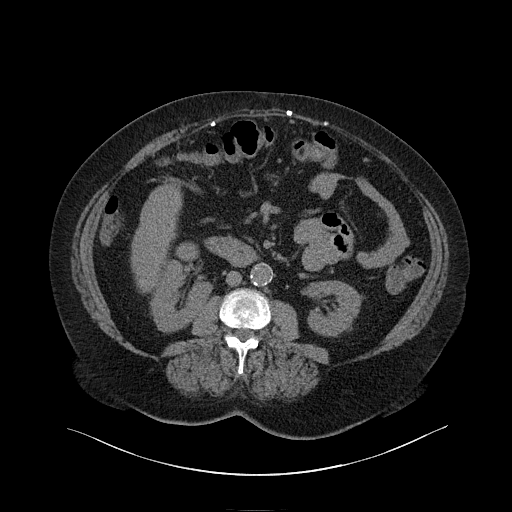
[im 54/88  soft-tissue]
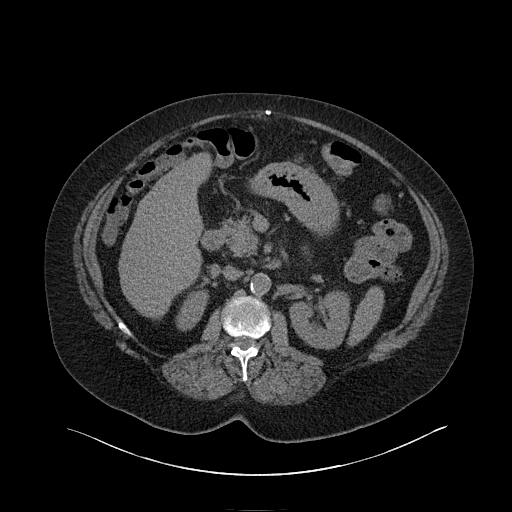
[im 54/88  bone]
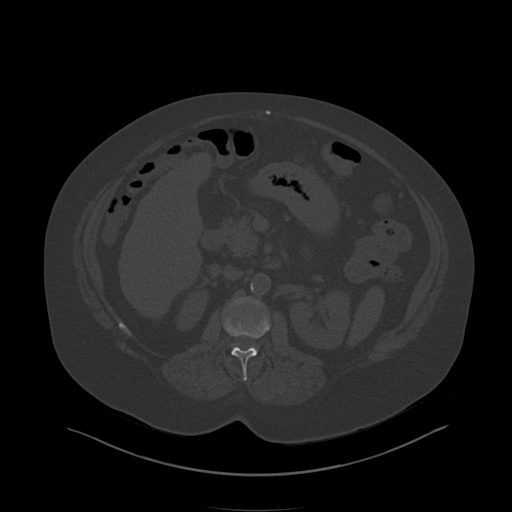
[im 59/88  soft-tissue]
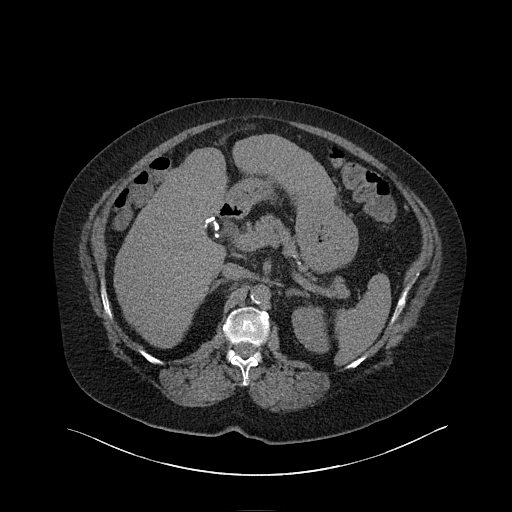
[im 65/88  soft-tissue]
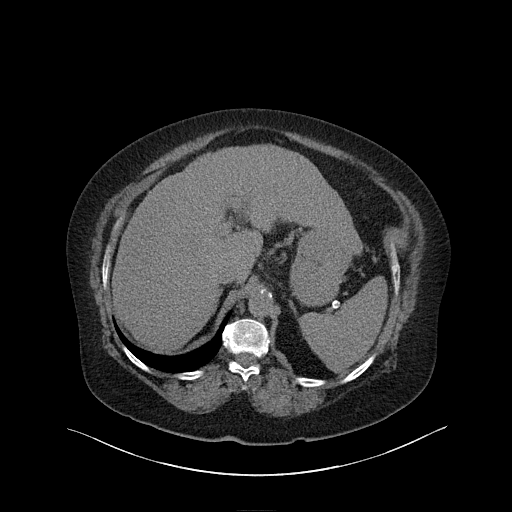
[im 71/88  soft-tissue]
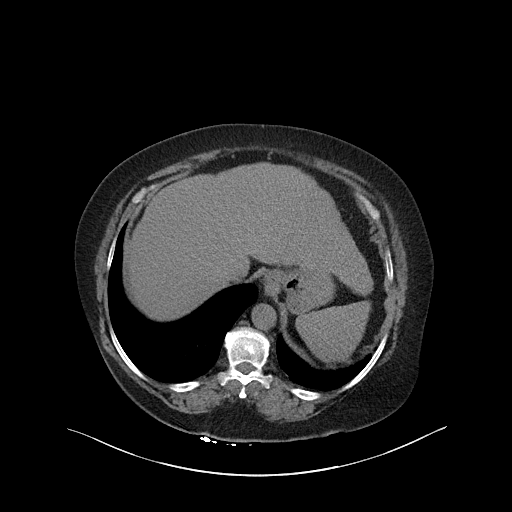
[im 76/88  soft-tissue]
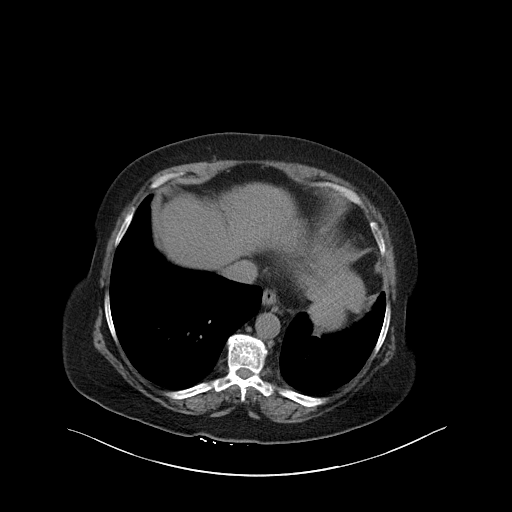
[im 82/88  soft-tissue]
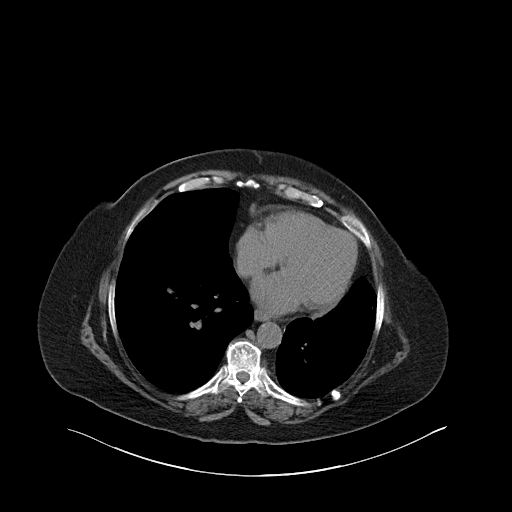

[Series 5: renal stone 3.0 cor · coronal · 0.86mm/px · 3 of 108 slices shown]
[im 36/108  soft-tissue]
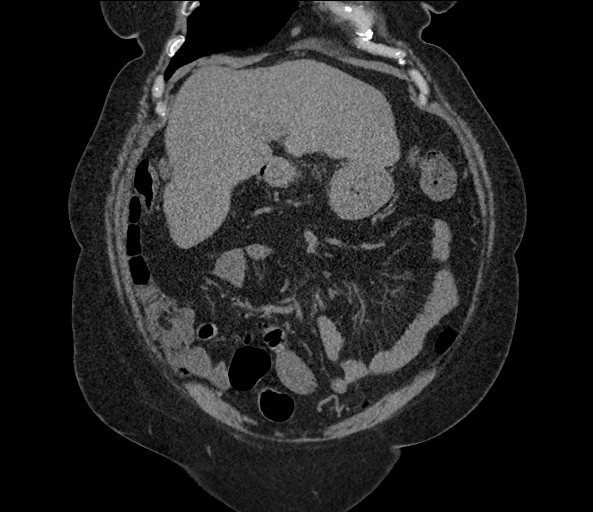
[im 48/108  soft-tissue]
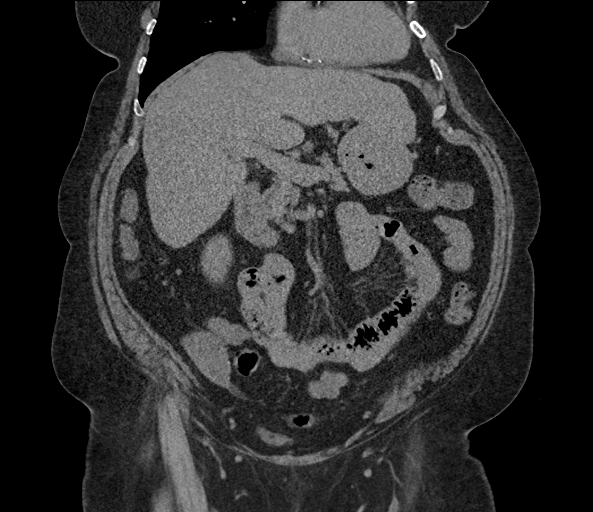
[im 60/108  soft-tissue]
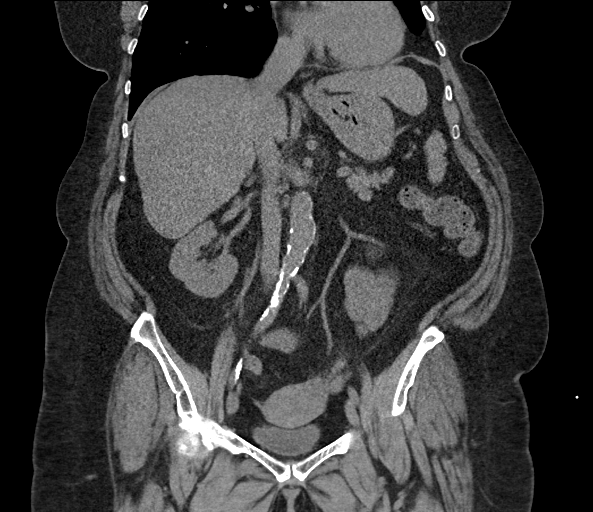

[17 of 46 positions shown; findings below may reference images not displayed]

FINDINGS: Lower chest: No acute abnormality.

Hepatobiliary: No focal liver abnormality is seen. Mild nodular
contour of the liver as can be seen with cirrhosis. Prior
cholecystectomy. No intrahepatic or extrahepatic biliary ductal
dilatation.

Pancreas: Unremarkable. No pancreatic ductal dilatation or
surrounding inflammatory changes.

Spleen: Normal in size without focal abnormality.

Adrenals/Urinary Tract: Adrenal glands are unremarkable. Kidneys are
normal, without renal calculi, focal lesion, or hydronephrosis.
Bladder is unremarkable.

Stomach/Bowel: Stomach is within normal limits. Appendix appears
normal. No evidence of bowel wall thickening, distention, or
inflammatory changes.

Vascular/Lymphatic: Abdominal aortic atherosclerosis. Normal caliber
abdominal aorta. No lymphadenopathy.

Reproductive: Uterus and bilateral adnexa are unremarkable.

Other: Prior ventral abdominal hernia repair. No abdominal or pelvic
ascites.

Musculoskeletal: No acute osseous abnormality. No aggressive osseous
lesion. Mild osteoarthritis of bilateral sacroiliac joints.
IMPRESSION: 1. No acute abdominal or pelvic pathology.
2.  Aortic Atherosclerosis ([id number]).
3. Cirrhosis.

## 2019-03-28 ENCOUNTER — Other Ambulatory Visit: Payer: Self-pay | Admitting: Physician Assistant

## 2019-03-28 DIAGNOSIS — Z1231 Encounter for screening mammogram for malignant neoplasm of breast: Secondary | ICD-10-CM

## 2019-05-07 ENCOUNTER — Other Ambulatory Visit: Payer: Self-pay

## 2019-05-07 ENCOUNTER — Ambulatory Visit
Admission: RE | Admit: 2019-05-07 | Discharge: 2019-05-07 | Disposition: A | Discharge status: Home / Self Care | Payer: Medicare Other | Source: Ambulatory Visit | Attending: Physician Assistant | Admitting: Physician Assistant

## 2019-05-07 DIAGNOSIS — Z1231 Encounter for screening mammogram for malignant neoplasm of breast: Secondary | ICD-10-CM

## 2020-01-29 ENCOUNTER — Other Ambulatory Visit: Payer: Self-pay | Admitting: Physician Assistant

## 2020-01-29 DIAGNOSIS — E2839 Other primary ovarian failure: Secondary | ICD-10-CM

## 2020-06-15 ENCOUNTER — Other Ambulatory Visit: Payer: Self-pay | Admitting: Physician Assistant

## 2020-06-15 DIAGNOSIS — Z1231 Encounter for screening mammogram for malignant neoplasm of breast: Secondary | ICD-10-CM

## 2020-09-06 ENCOUNTER — Other Ambulatory Visit (HOSPITAL_COMMUNITY)
Admission: RE | Admit: 2020-09-06 | Discharge: 2020-09-06 | Disposition: A | Discharge status: Home / Self Care | Payer: Medicare Other | Source: Other Acute Inpatient Hospital | Attending: Infectious Diseases | Admitting: Infectious Diseases

## 2020-09-06 DIAGNOSIS — N390 Urinary tract infection, site not specified: Secondary | ICD-10-CM | POA: Diagnosis present

## 2020-09-06 LAB — CBC WITH DIFFERENTIAL/PLATELET
Abs Immature Granulocytes: 0.01 10*3/uL (ref 0.00–0.07)
Basophils Absolute: 0.1 10*3/uL (ref 0.0–0.1)
Basophils Relative: 1 %
Eosinophils Absolute: 0.5 10*3/uL (ref 0.0–0.5)
Eosinophils Relative: 8 %
HCT: 38.4 % (ref 36.0–46.0)
Hemoglobin: 12.5 g/dL (ref 12.0–15.0)
Immature Granulocytes: 0 %
Lymphocytes Relative: 26 %
Lymphs Abs: 1.5 10*3/uL (ref 0.7–4.0)
MCH: 30.7 pg (ref 26.0–34.0)
MCHC: 32.6 g/dL (ref 30.0–36.0)
MCV: 94.3 fL (ref 80.0–100.0)
Monocytes Absolute: 0.7 10*3/uL (ref 0.1–1.0)
Monocytes Relative: 12 %
Neutro Abs: 3.1 10*3/uL (ref 1.7–7.7)
Neutrophils Relative %: 53 %
Platelets: 134 10*3/uL — ABNORMAL LOW (ref 150–400)
RBC: 4.07 MIL/uL (ref 3.87–5.11)
RDW: 14.1 % (ref 11.5–15.5)
WBC: 5.8 10*3/uL (ref 4.0–10.5)
nRBC: 0 % (ref 0.0–0.2)

## 2020-09-06 LAB — COMPREHENSIVE METABOLIC PANEL
ALT: 11 U/L (ref 0–44)
AST: 36 U/L (ref 15–41)
Albumin: 3.3 g/dL — ABNORMAL LOW (ref 3.5–5.0)
Alkaline Phosphatase: 99 U/L (ref 38–126)
Anion gap: 9 (ref 5–15)
BUN: 6 mg/dL — ABNORMAL LOW (ref 8–23)
CO2: 30 mmol/L (ref 22–32)
Calcium: 8.5 mg/dL — ABNORMAL LOW (ref 8.9–10.3)
Chloride: 97 mmol/L — ABNORMAL LOW (ref 98–111)
Creatinine, Ser: 0.51 mg/dL (ref 0.44–1.00)
GFR, Estimated: >60 mL/min (ref >60)
Glucose, Bld: 148 mg/dL — ABNORMAL HIGH (ref 70–99)
Potassium: 3.1 mmol/L — ABNORMAL LOW (ref 3.5–5.1)
Sodium: 136 mmol/L (ref 135–145)
Total Bilirubin: 0.7 mg/dL (ref 0.3–1.2)
Total Protein: 6.3 g/dL — ABNORMAL LOW (ref 6.5–8.1)

## 2020-09-27 ENCOUNTER — Other Ambulatory Visit: Payer: Medicare Other

## 2020-09-27 ENCOUNTER — Ambulatory Visit: Payer: Medicare Other

## 2021-01-05 ENCOUNTER — Other Ambulatory Visit: Payer: Self-pay | Admitting: Physician Assistant

## 2021-01-05 DIAGNOSIS — E2839 Other primary ovarian failure: Secondary | ICD-10-CM

## 2021-01-07 ENCOUNTER — Other Ambulatory Visit: Payer: Medicare Other

## 2021-01-07 ENCOUNTER — Inpatient Hospital Stay: Admission: RE | Admit: 2021-01-07 | Payer: Medicare Other | Source: Ambulatory Visit

## 2021-02-25 ENCOUNTER — Ambulatory Visit: Payer: Medicare Other

## 2021-06-21 ENCOUNTER — Other Ambulatory Visit: Payer: Medicare Other

## 2024-01-01 DEATH — deceased
